# Patient Record
Sex: Female | Born: 1995 | Race: White | Hispanic: No | Marital: Single | Smoking: Never smoker
Health system: Southern US, Community
[De-identification: ages and names within clinical notes are randomized; demographics above are authoritative.]

## PROBLEM LIST (undated history)

## (undated) DIAGNOSIS — F419 Anxiety disorder, unspecified: Secondary | ICD-10-CM

---
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
## 2022-07-27 NOTE — Progress Notes (Signed)
Cardiology Office Note   Date:  07/28/2022   ID:  Sonya Griffin, DOB [birth year], MRN [id number]  PCP:  Kennith Maes, PA-C  Cardiologist:   None Referring:  Kennith Maes, PA-C  Chief Complaint  Patient presents with   Chest Pain      History of Present Illness: Sonya Griffin is a 27 y.o. female who presents for evaluation of chest pain.  She had a history of this 8 years ago and was in the emergency room.  She was told this was panic at that time.  She had anxiety and she is recently started Lexapro for this.  She is a Midwife.  She likes to weight lift for exercise but does not do a lot of aerobic exercise.  She developed chest discomfort she said 8 years ago this been sporadic.  It might be a little more frequent recently.  It is sharp.  It might happen daily.  Its on the left side.  It last for minutes.  It can be 3 out of 10 in intensity.  She cannot bring it on.  It comes at rest.  There is no radiation to her jaw or to her arms.  She does not think it necessarily happens when she has panic but she could have discomfort when she is panic.  She also feels some fluttering and skipping beats occasionally.  She has not had any syncope however.  She has never had any cardiac workup.   Past Medical History:  Diagnosis Date   Anxiety     History reviewed. No pertinent surgical history.   Current Outpatient Medications  Medication Sig Dispense Refill   escitalopram (LEXAPRO) 10 MG tablet Take 10 mg by mouth daily.     HAILEY FE 1.5/30 1.5-30 MG-MCG tablet Take 1 tablet by mouth daily.     ibuprofen (ADVIL) 200 MG tablet Take 200 mg by mouth as needed for mild pain (2 tablets).     No current facility-administered medications for this visit.    Allergies:   Patient has no known allergies.    Social History:  The patient  reports that she has never smoked. She has never used smokeless tobacco. She reports that she does not drink alcohol and does not use drugs.    Family History:  The patient's family history is noncontributory for early coronary disease.   ROS:  Please see the history of present illness.   Otherwise, review of systems are positive for none.   All other systems are reviewed and negative.    PHYSICAL EXAM: VS:  BP 122/78   Pulse 70   Ht 5\' 2"  (1.575 m)   Wt 121 lb 9.6 oz (55.2 kg)   SpO2 99%   BMI 22.24 kg/m  , BMI Body mass index is 22.24 kg/m. GENERAL:  Well appearing HEENT:  Pupils equal round and reactive, fundi not visualized, oral mucosa unremarkable NECK:  No jugular venous distention, waveform within normal limits, carotid upstroke brisk and symmetric, no bruits, no thyromegaly LYMPHATICS:  No cervical, inguinal adenopathy LUNGS:  Clear to auscultation bilaterally BACK:  No CVA tenderness CHEST:  Unremarkable HEART:  PMI not displaced or sustained,S1 and S2 within normal limits, no S3, no S4, no clicks, no rubs, no murmurs ABD:  Flat, positive bowel sounds normal in frequency in pitch, no bruits, no rebound, no guarding, no midline pulsatile mass, no hepatomegaly, no splenomegaly EXT:  2 plus pulses throughout, no edema, no cyanosis no clubbing SKIN:  No rashes no nodules NEURO:  Cranial nerves II through XII grossly intact, motor grossly intact throughout PSYCH:  Cognitively intact, oriented to person place and time    EKG:  EKG is ordered today. The ekg ordered today demonstrates sinus rhythm, rate 70, axis within normal limits, intervals within normal limits, no acute ST-T wave changes.   Recent Labs: No results found for requested labs within last 365 days.    Lipid Panel No results found for: "CHOL", "TRIG", "HDL", "CHOLHDL", "VLDL", "LDLCALC", "LDLDIRECT"    Wt Readings from Last 3 Encounters:  07/28/22 121 lb 9.6 oz (55.2 kg)      Other studies Reviewed: Additional studies/ records that were reviewed today include: Labs. Review of the above records demonstrates:  Please see elsewhere in the  note.     ASSESSMENT AND PLAN:  Chest pain: Her chest discomfort is not anginal.  She has a normal exam and EKG.  I think the pretest probability of a cardiac etiology is very low and I would not suggest further workup.  Palpitations: She is likely having premature atrial or ventricular contractions in the evening not particularly problematic.  No change in therapy is indicated.   Panic anxiety: Discussed the contribution of exercise to managing this.  We talked about specific suggestions.   Current medicines are reviewed at length with the patient today.  The patient does not have concerns regarding medicines.  The following changes have been made:  no change  Labs/ tests ordered today include:   Orders Placed This Encounter  Procedures   EKG 12-Lead     Disposition:   FU with me as needed.      Signed, Rollene Rotunda, MD  07/28/2022 3:37 PM    [facility]

## 2022-07-28 ENCOUNTER — Encounter: Payer: Self-pay | Admitting: Cardiology

## 2022-07-28 ENCOUNTER — Ambulatory Visit: Payer: BC Managed Care – PPO | Attending: Cardiology | Admitting: Cardiology

## 2022-07-28 VITALS — BP 122/78 | HR 70 | Ht 62.0 in | Wt 121.6 lb

## 2022-07-28 DIAGNOSIS — R072 Precordial pain: Secondary | ICD-10-CM

## 2022-07-28 NOTE — Patient Instructions (Signed)
    Follow-Up: At [facility], you and your health needs are our priority.  As part of our continuing mission to provide you with exceptional heart care, we have created designated Provider Care Teams.  These Care Teams include your primary Cardiologist (physician) and Advanced Practice Providers (APPs -  Physician Assistants and Nurse Practitioners) who all work together to provide you with the care you need, when you need it.  We recommend signing up for the patient portal called "MyChart".  Sign up information is provided on this After Visit Summary.  MyChart is used to connect with patients for Virtual Visits (Telemedicine).  Patients are able to view lab/test results, encounter notes, upcoming appointments, etc.  Non-urgent messages can be sent to your provider as well.   To learn more about what you can do with MyChart, go to [email or web address].    Your next appointment:   As needed
